# Patient Record
Sex: Male | Born: 1955 | Race: Black or African American | Hispanic: No | Marital: Single
Health system: Southern US, Community
[De-identification: ages and names within clinical notes are randomized; demographics above are authoritative.]

---

## 2006-08-01 ENCOUNTER — Inpatient Hospital Stay (HOSPITAL_COMMUNITY): Admission: EM | Admit: 2006-08-01 | Discharge: 2006-08-02 | Payer: Self-pay | Admitting: Emergency Medicine

## 2008-06-04 IMAGING — CR DG CHEST 1V PORT
1 series · 1 of 1 positions shown · non-contrast
Comparison: None.

CLINICAL DATA: Chest pain. 
 PORTABLE CHEST 3383 HOURS:

[view not recorded]
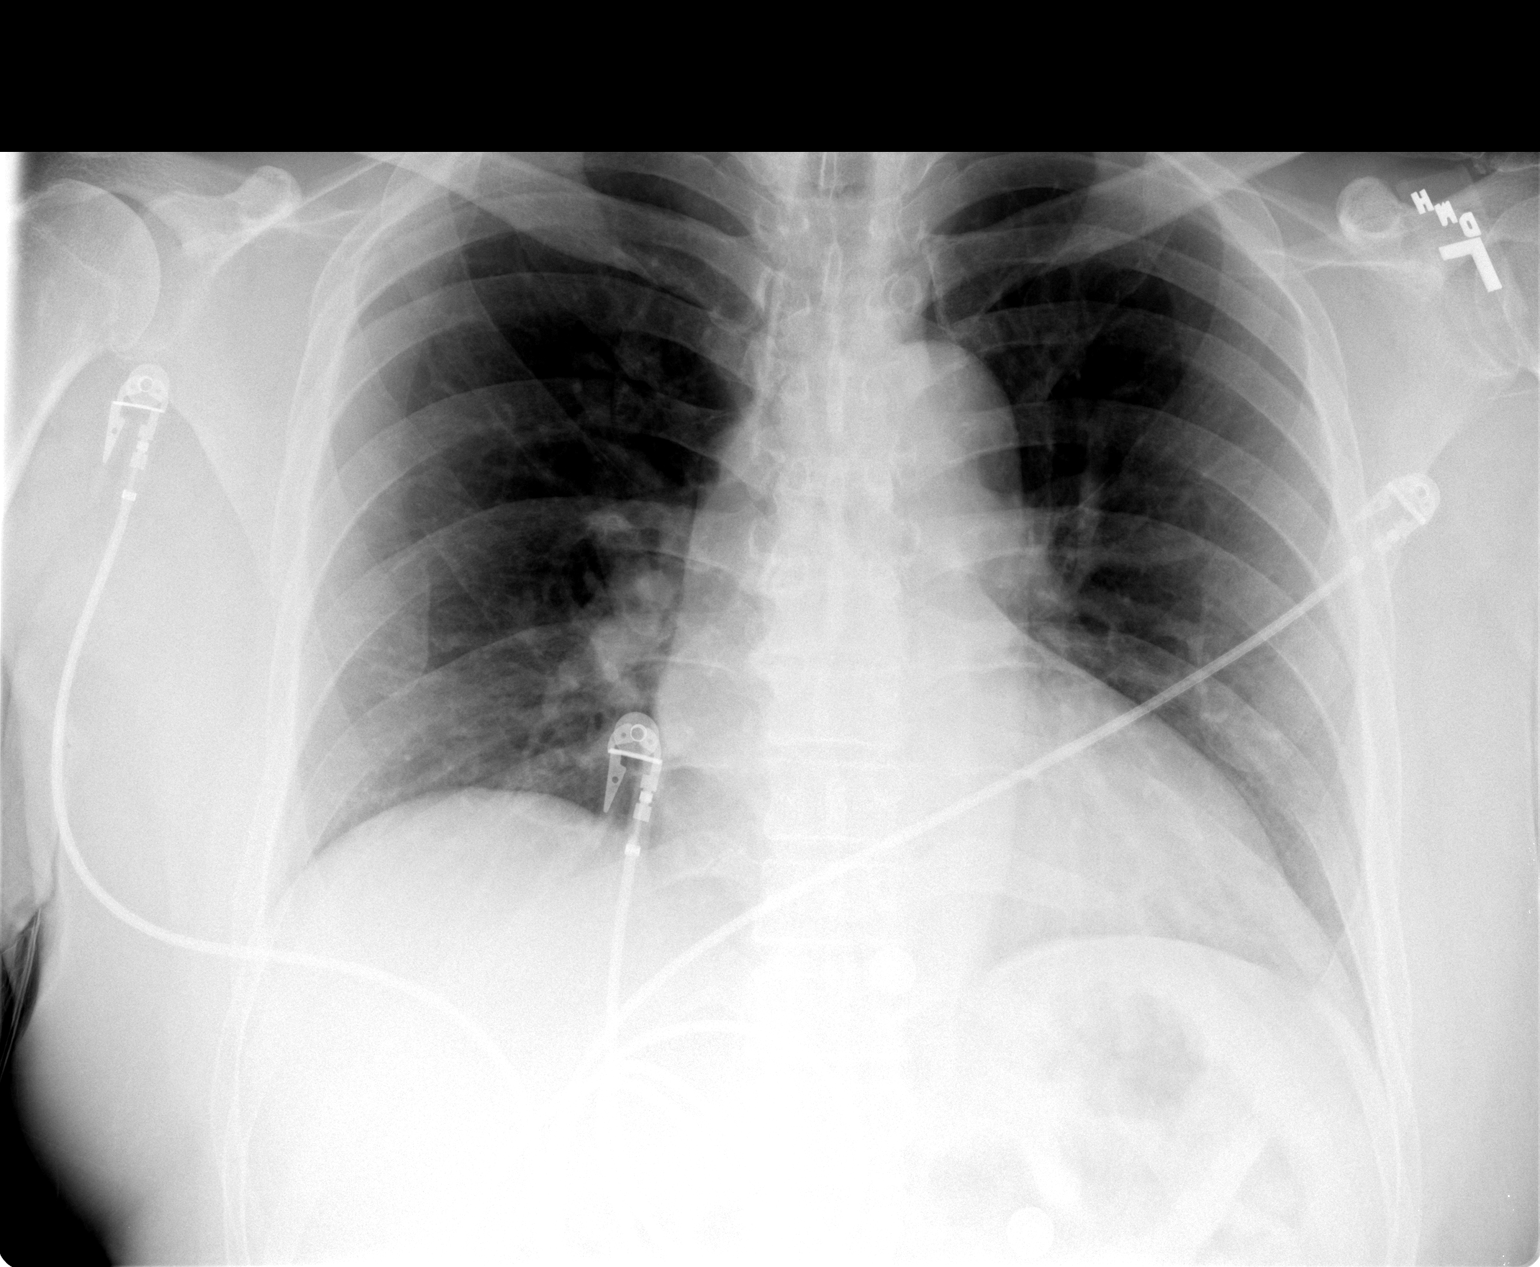

[1 of 1 positions shown; findings below may reference images not displayed]

FINDINGS: The heart is mildly enlarged. There is no heart failure, infiltrate or effusion.
IMPRESSION: No acute abnormality.

## 2017-02-12 ENCOUNTER — Encounter: Payer: Self-pay | Admitting: Pediatric Intensive Care

## 2017-03-19 NOTE — Congregational Nurse Program (Signed)
Congregational Nurse Program Note  Date of Encounter: 02/12/2017  Past Medical History: No past medical history on file.  Encounter Details:  New client. [place]. He is service connected and previously received care via the [facility]. He states he has a history of HTN and kidney stones. He was taking medication for HTN but has not had access to medication since he moved. Client has pitting edema of lower extremities. BBS- Clear. CN advises client walk [facility] until he can go back to [place]. CN [facility]. Client will follow up in CN clinic as needed.

## 2017-12-12 ENCOUNTER — Ambulatory Visit: Payer: Non-veteran care | Admitting: Podiatry

## 2023-06-15 DEATH — deceased
